# Patient Record
Sex: Female | Born: 1975 | Race: White | Hispanic: No | Marital: Married | Smoking: Never smoker
Health system: Southern US, Community
[De-identification: ages and names within clinical notes are randomized; demographics above are authoritative.]

## PROBLEM LIST (undated history)

## (undated) DIAGNOSIS — Q796 Ehlers-Danlos syndrome, unspecified: Secondary | ICD-10-CM

## (undated) HISTORY — PX: TONSILLECTOMY: SUR1361

## (undated) HISTORY — PX: APPENDECTOMY: SHX54

## (undated) HISTORY — PX: CERVICAL SPINE SURGERY: SHX589

## (undated) HISTORY — PX: DILATION AND CURETTAGE OF UTERUS: SHX78

---

## 1999-04-19 ENCOUNTER — Encounter: Payer: Self-pay | Admitting: Emergency Medicine

## 1999-04-19 ENCOUNTER — Emergency Department (HOSPITAL_COMMUNITY): Admission: EM | Admit: 1999-04-19 | Discharge: 1999-04-19 | Payer: Self-pay | Admitting: Emergency Medicine

## 2001-07-10 ENCOUNTER — Encounter: Admission: RE | Admit: 2001-07-10 | Discharge: 2001-10-08 | Payer: Self-pay | Admitting: Family Medicine

## 2003-03-10 ENCOUNTER — Emergency Department (HOSPITAL_COMMUNITY): Admission: EM | Admit: 2003-03-10 | Discharge: 2003-03-10 | Payer: Self-pay | Admitting: Emergency Medicine

## 2004-10-19 ENCOUNTER — Other Ambulatory Visit: Admission: RE | Admit: 2004-10-19 | Discharge: 2004-10-19 | Payer: Self-pay | Admitting: Obstetrics and Gynecology

## 2005-02-01 ENCOUNTER — Encounter: Admission: RE | Admit: 2005-02-01 | Discharge: 2005-02-01 | Payer: Self-pay | Admitting: Obstetrics and Gynecology

## 2006-02-03 ENCOUNTER — Encounter: Admission: RE | Admit: 2006-02-03 | Discharge: 2006-02-03 | Payer: Self-pay | Admitting: Obstetrics and Gynecology

## 2006-05-23 ENCOUNTER — Other Ambulatory Visit: Admission: RE | Admit: 2006-05-23 | Discharge: 2006-05-23 | Payer: Self-pay | Admitting: Gynecology

## 2006-09-19 ENCOUNTER — Inpatient Hospital Stay (HOSPITAL_COMMUNITY): Admission: EM | Admit: 2006-09-19 | Discharge: 2006-09-25 | Payer: Self-pay | Admitting: Emergency Medicine

## 2006-09-19 ENCOUNTER — Ambulatory Visit: Payer: Self-pay | Admitting: Cardiology

## 2006-09-20 ENCOUNTER — Encounter (INDEPENDENT_AMBULATORY_CARE_PROVIDER_SITE_OTHER): Payer: Self-pay | Admitting: Internal Medicine

## 2008-10-31 IMAGING — CR DG CHEST 2V
2 series · 2 of 2 positions shown · non-contrast
Comparison: none

CLINICAL DATA: Dizziness, vertigo, syncope

Chest 2 view:
Comparison 03/10/2003. The heart size and mediastinal contours are within normal
limits.  Both lungs are clear.  The visualized skeletal structures are
unremarkable.

[w chest pa]
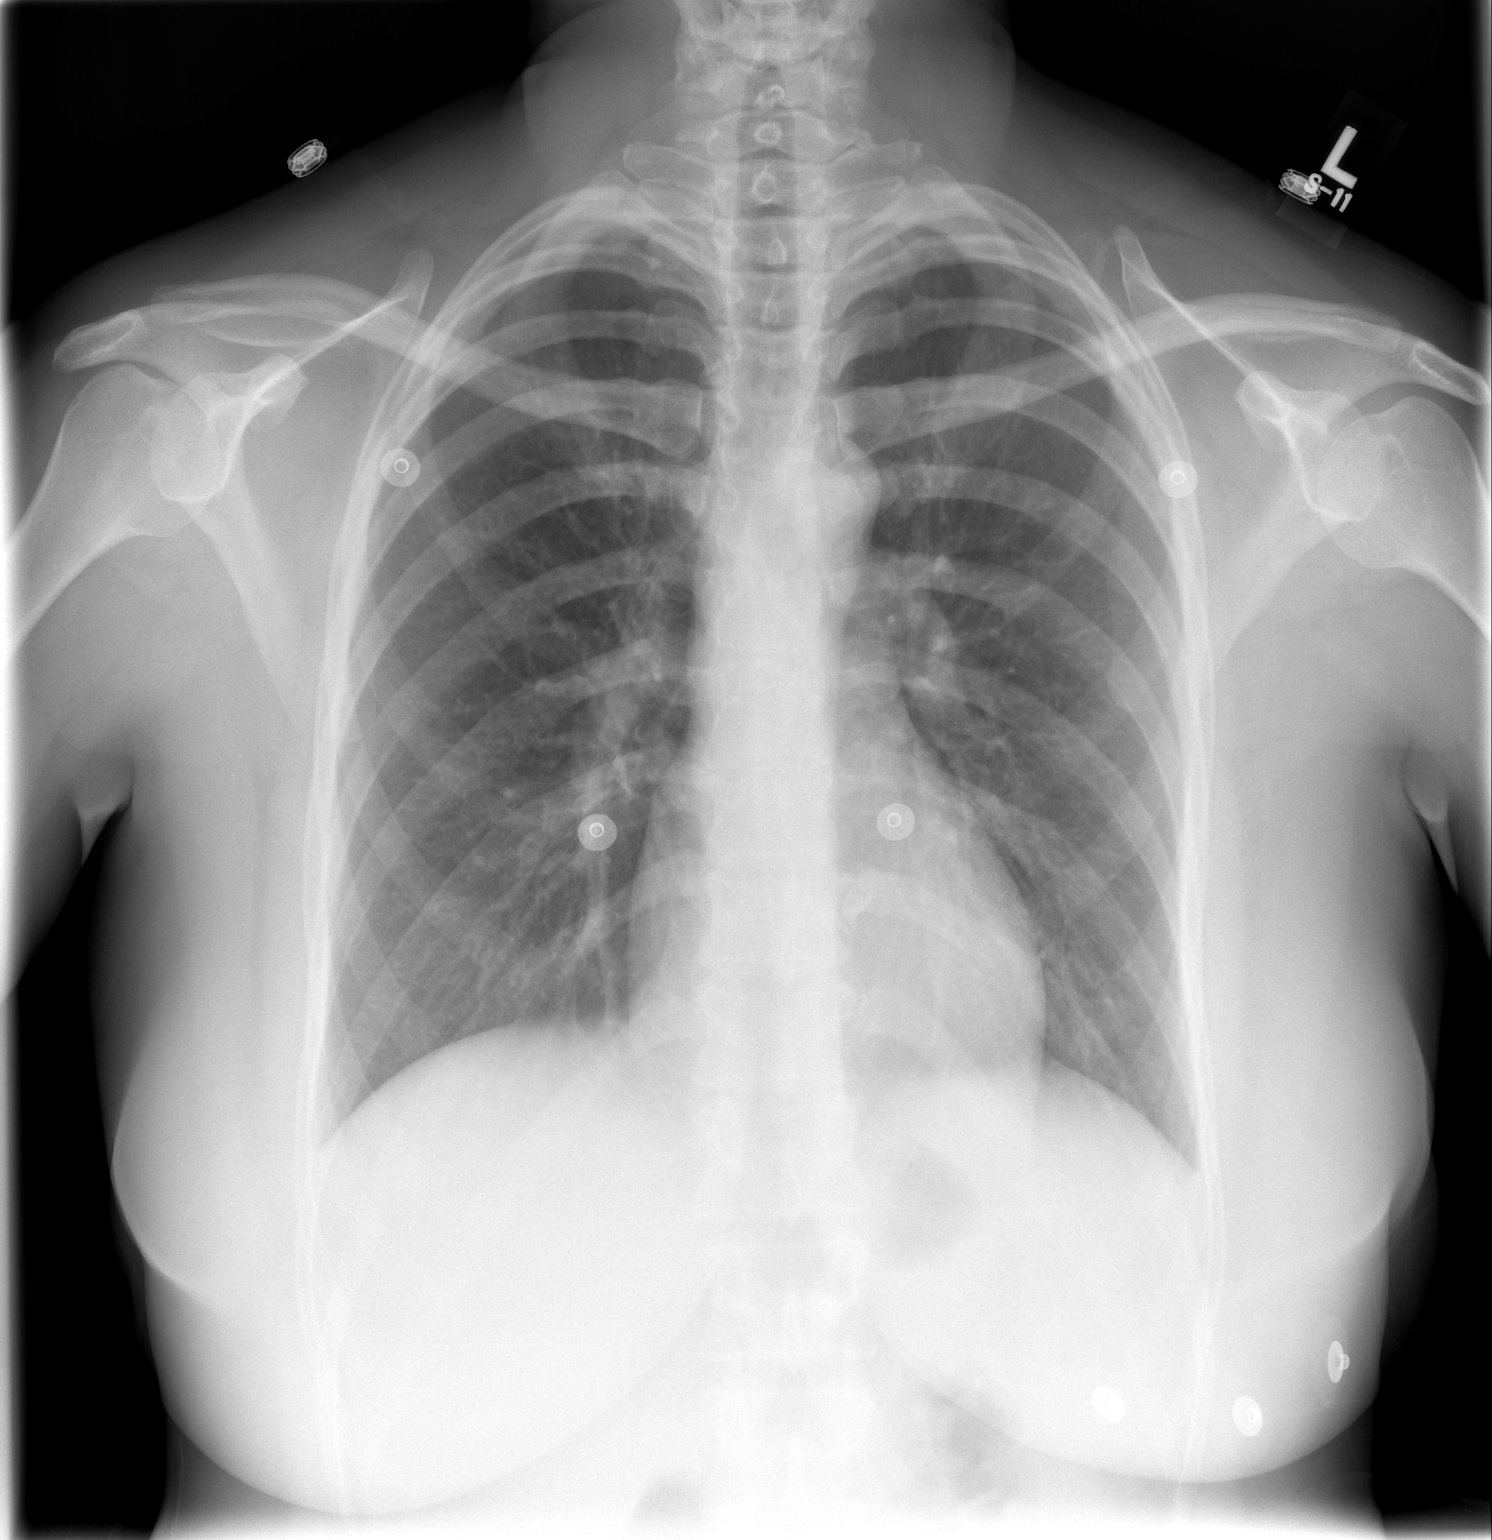

[w chest lat]
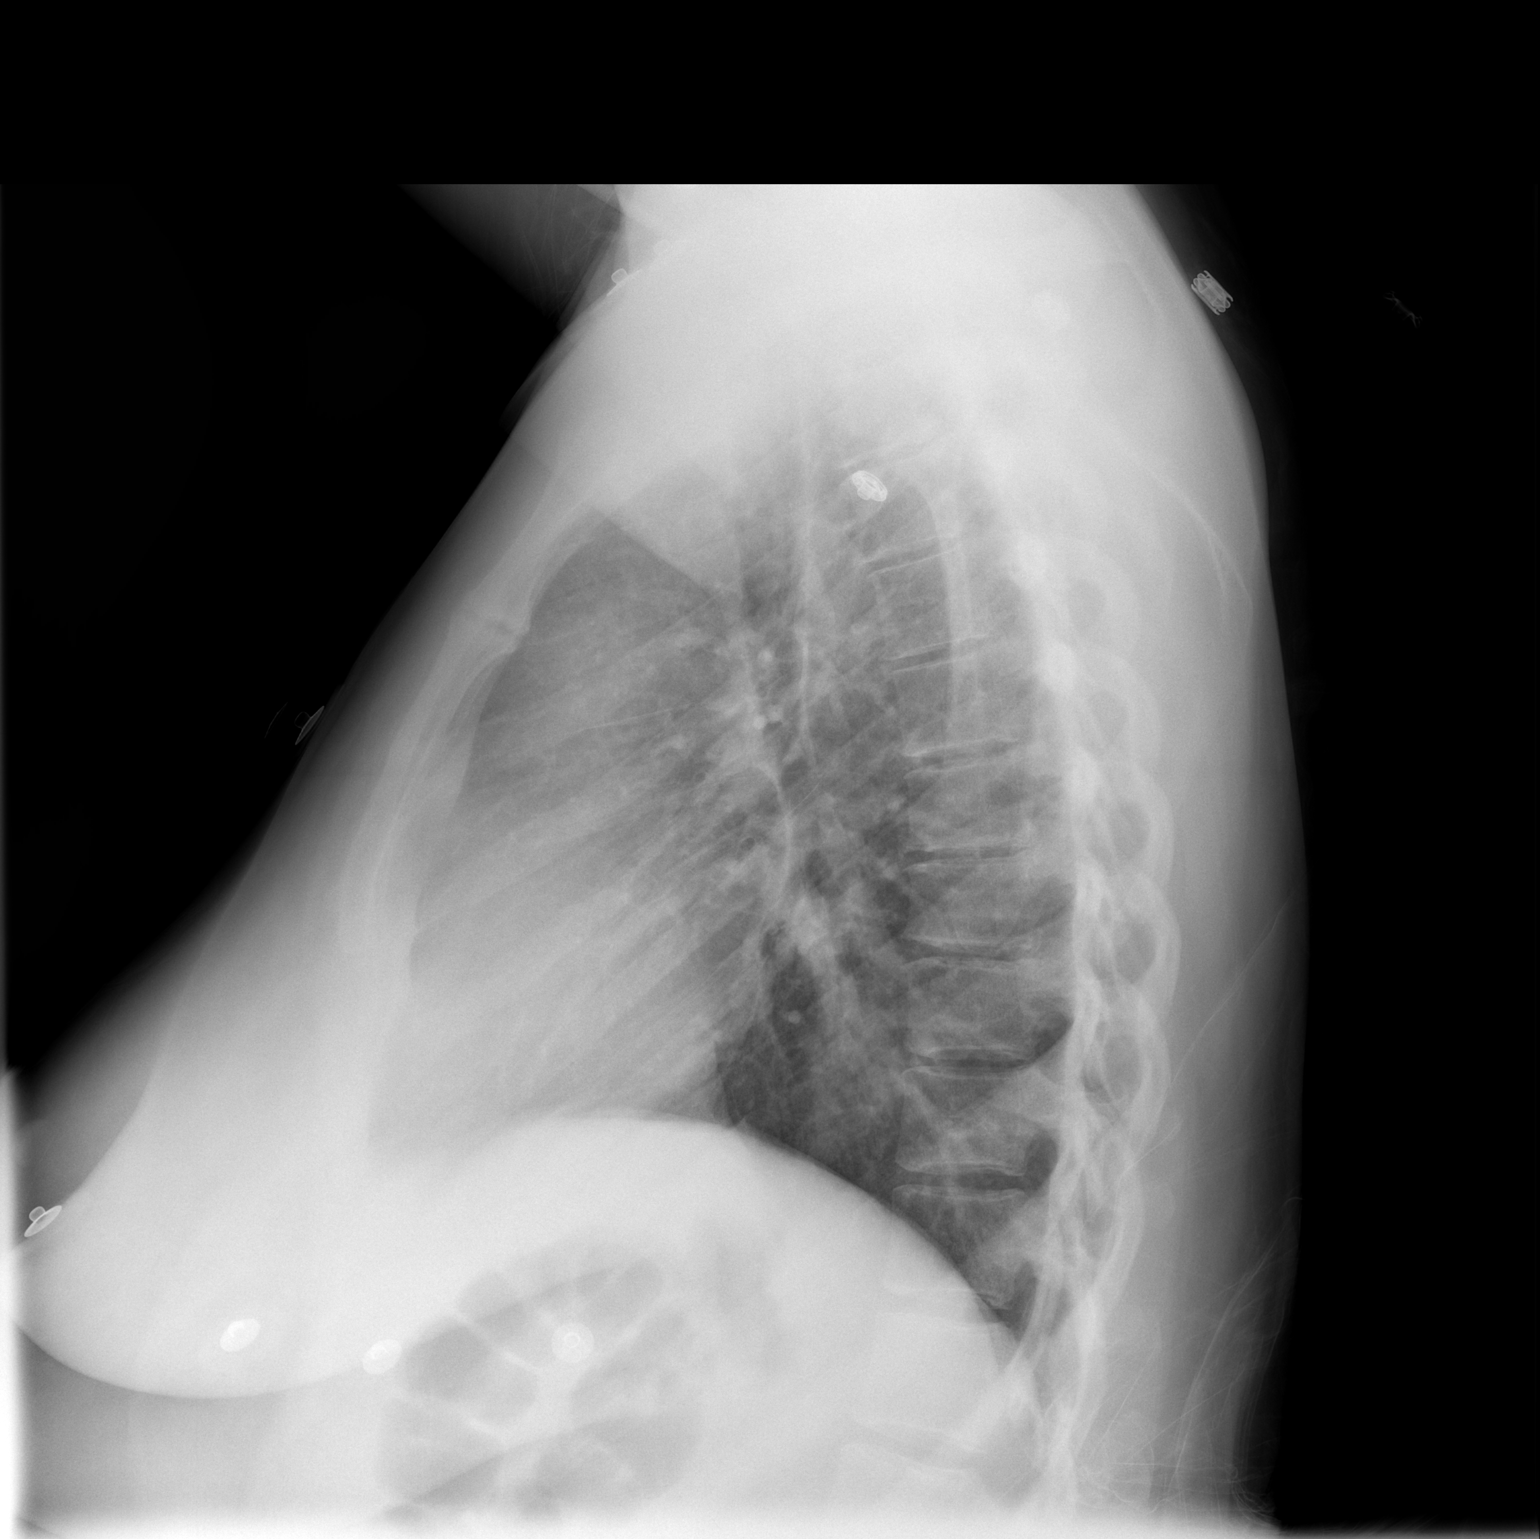

[2 of 2 positions shown; findings below may reference images not displayed]

IMPRESSION: 1. No active cardiopulmonary disease.

## 2008-11-05 IMAGING — CT CT ANGIO CHEST
2 of 4 series · 19 of 36 positions shown · IV contrast (100 ML OMNI 300)
Comparison: Chest radiographs dated 09/19/2006.

CLINICAL DATA: Shortness of breath. Multiple syncopal episodes. Dizziness.
Headache.

CT ANGIOGRAPHY OF CHEST - PULMONARY EMBOLISM PROTOCOL
TECHNIQUE: Multidetector CT imaging of the chest was performed according to the
protocol for detection of pulmonary embolism during bolus injection of
intravenous contrast.  Coronal and sagittal plane CT angiographic image
reconstructions were also generated.
Contrast:  100 cc Omnipaque 300

[Series 2: pe · axial · 0.65mm/px · z∈[-210,-26]mm · 16 of 168 slices shown]
[im 10/168  lung]
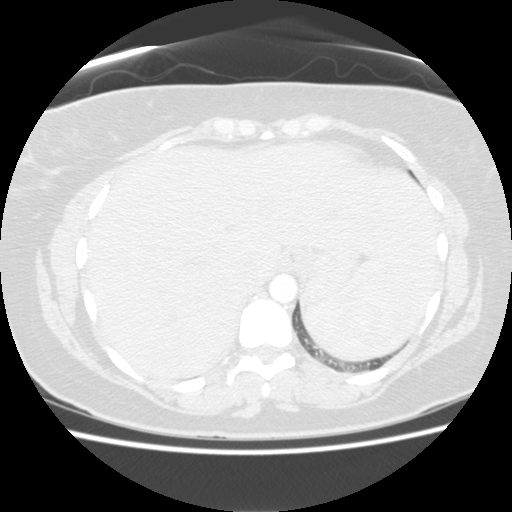
[im 19/168  mediastinal]
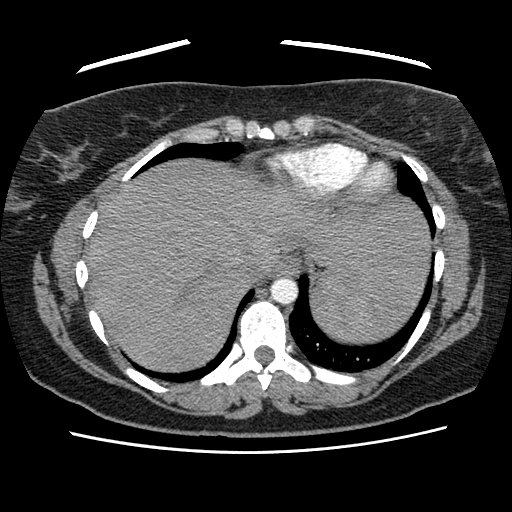
[im 28/168  lung]
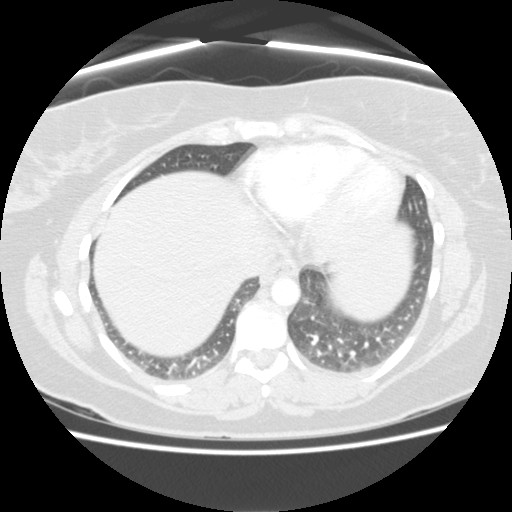
[im 38/168  mediastinal]
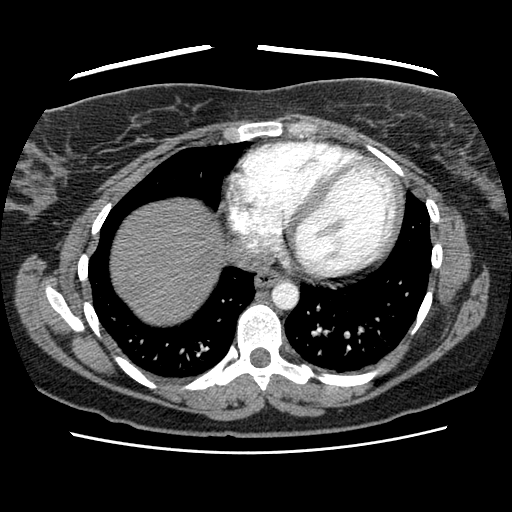
[im 47/168  lung]
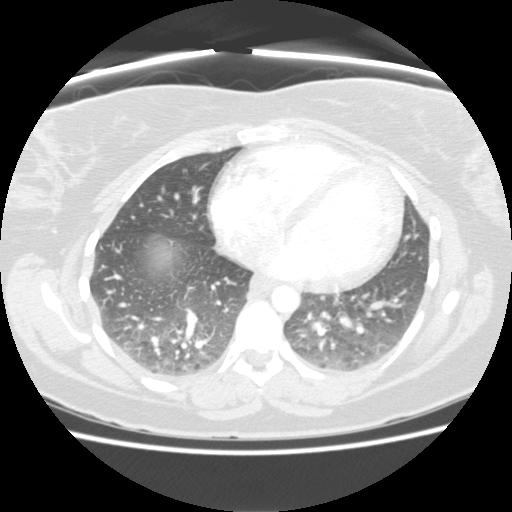
[im 56/168  mediastinal]
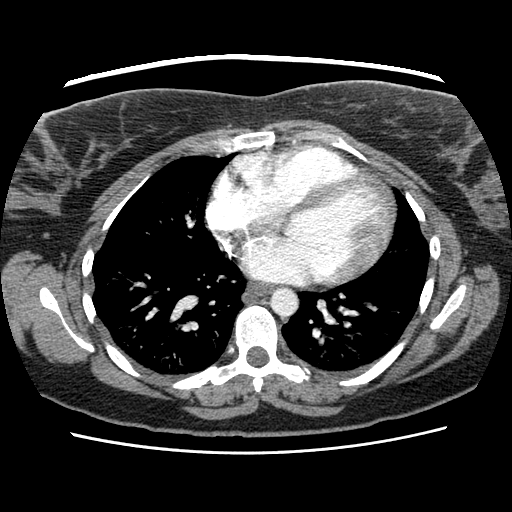
[im 65/168  lung]
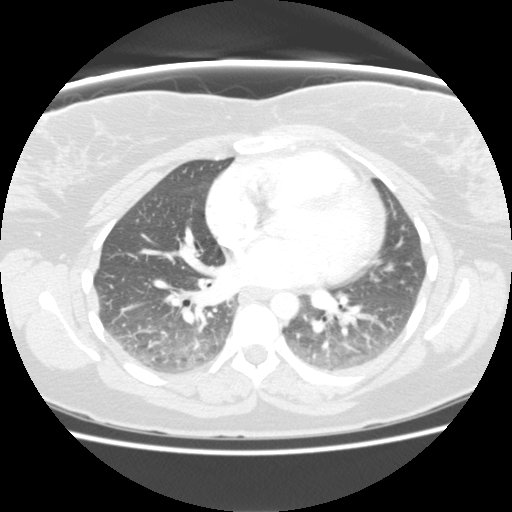
[im 75/168  mediastinal]
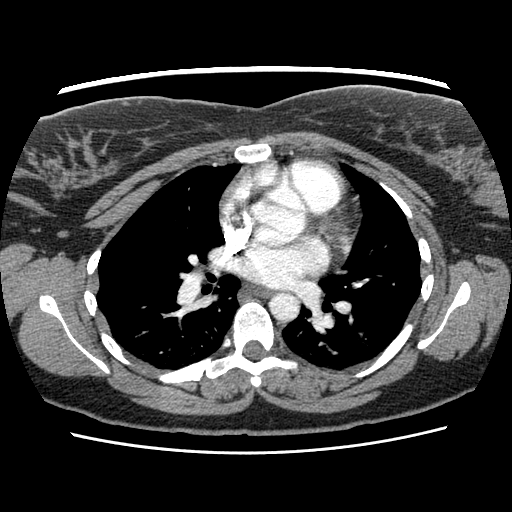
[im 93/168  lung]
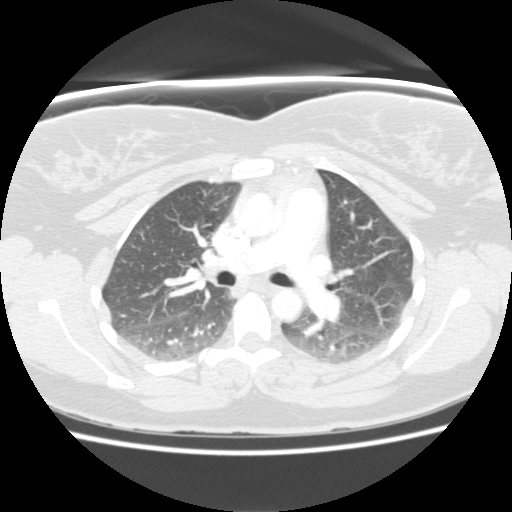
[im 103/168  mediastinal]
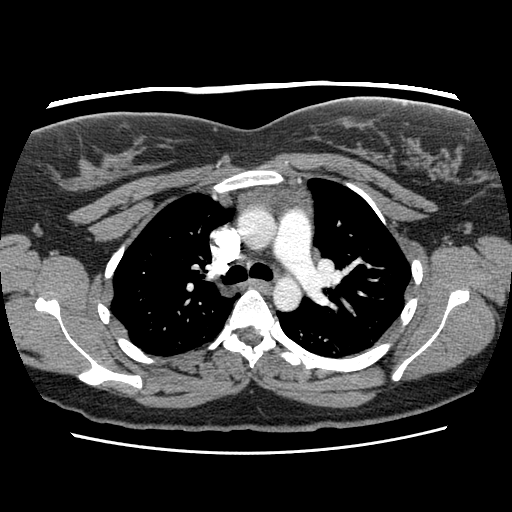
[im 112/168  lung]
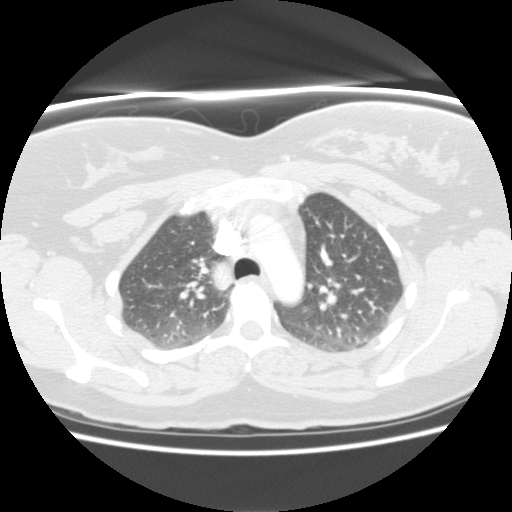
[im 121/168  mediastinal]
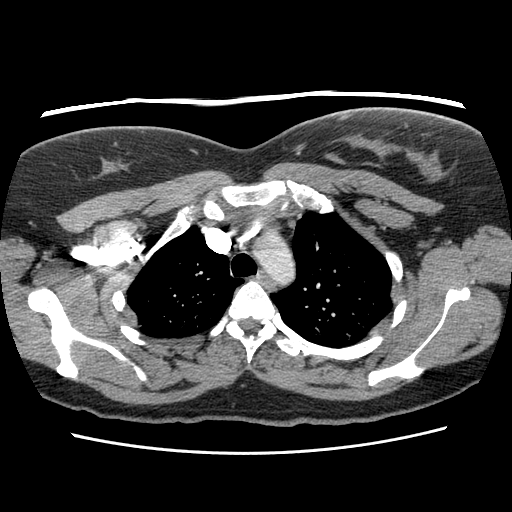
[im 130/168  lung]
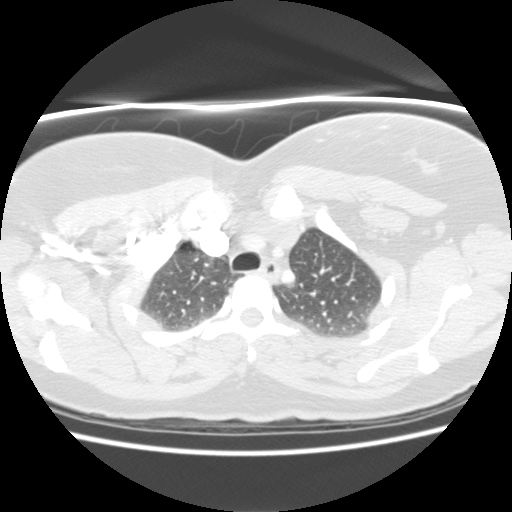
[im 140/168  mediastinal]
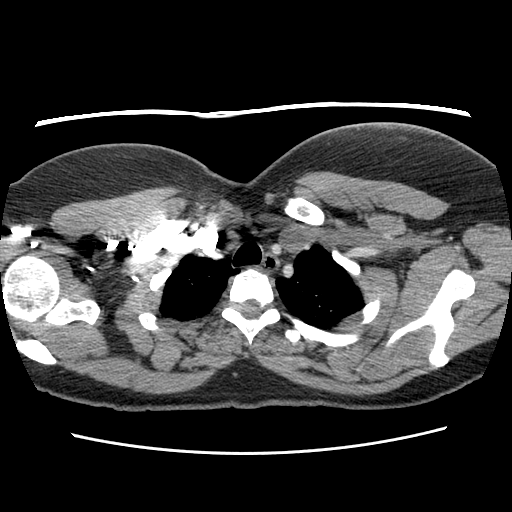
[im 149/168  lung]
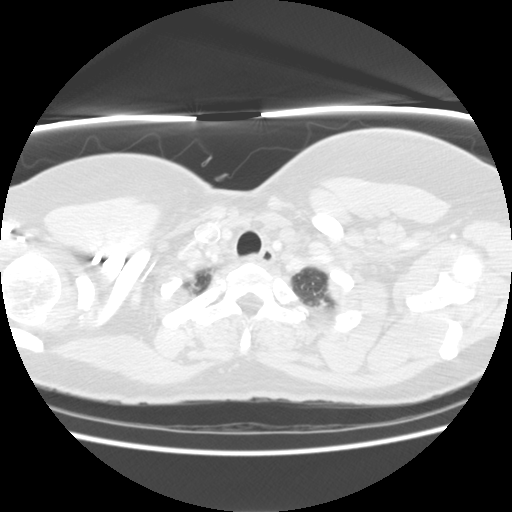
[im 158/168  mediastinal]
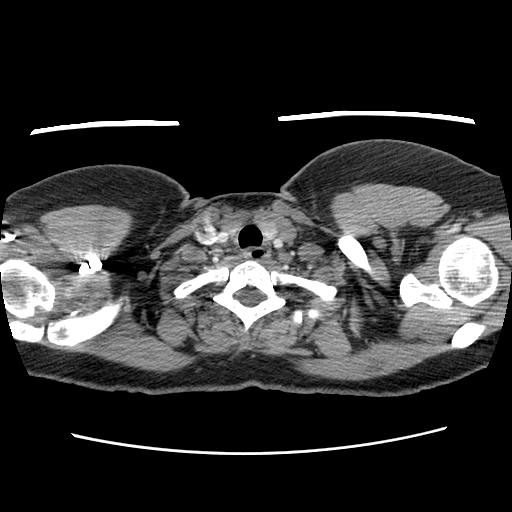

[Series 201: cor · coronal · 0.64mm/px · 3 of 108 slices shown]
[im 22/108  mediastinal]
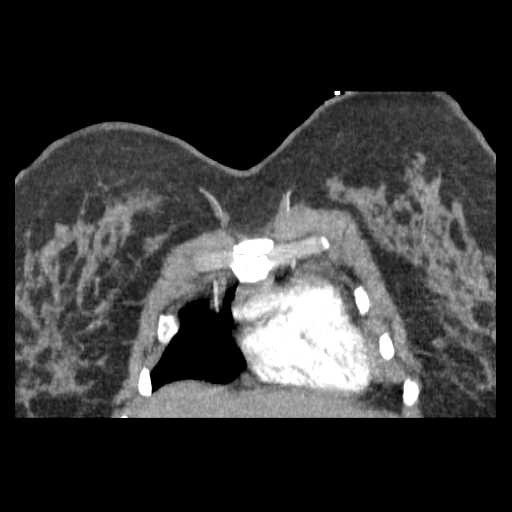
[im 43/108  mediastinal]
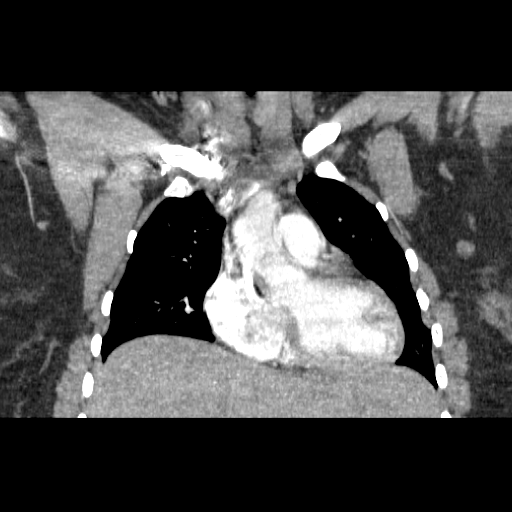
[im 65/108  mediastinal]
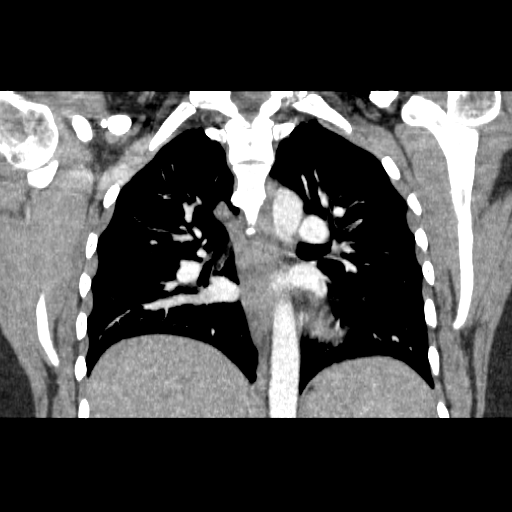

[19 of 36 positions shown; findings below may reference images not displayed]

FINDINGS: Normally opacified pulmonary arteries with no pulmonary arterial
filling defects seen. Mild dependent atelectasis in both lower lobes. No lung
masses or enlarged lymph nodes. No pleural fluid. Mild thoracic spine
degenerative changes.

IMPRESSION

No pulmonary emboli or acute abnormality.

## 2010-06-15 NOTE — H&P (Signed)
Marissa Boone             ACCOUNT NO.:  [id number]   MEDICAL RECORD NO.:  [id number]          PATIENT TYPE:  INP   LOCATION:  5502                         FACILITY:  [facility]   PHYSICIAN:  Kela Millin, M.D.DATE OF BIRTH:  15-Sep-1975   DATE OF PROCEDURE:  DATE OF DISCHARGE:                    STAT - MUST CHANGE TO CORRECT WORK TYPE   PRIMARY CARE PHYSICIAN:  [facility] (  Dr.'s [facility]).   CHIEF COMPLAINT:  Syncope.   HISTORY OF PRESENT ILLNESS:  The patient is a 35 year old white female  who presents after a syncopal episode at work.  She admits to feeling  (swimmy-headed) and having difficulty with her balance, as she was  walking prior to this episode.  She denies palpitations, chest pain,  focal weakness, dysphagia and no blurry vision.  Marissa Boone states  that she was in her usual state of health, until about a month ago -  July 22nd, when she developed flu-like symptoms - feeling achy, tired,  nauseous with subjective fevers and the time and just overall feeling  run down and also felt anxious, and this was shortly after she had  moved from one apartment to the other.  She was also having problems  with decreased p.o. intake, and subsequently on July 31st, she went to  the [facility] office, and following her evaluation she  was diagnosed with a viral illness, and then was started on Lexapro for  her anxiety.  She states that about a week later, she began having  dizziness, which she describes as feeling swimmy-headed and losing her  balance.  A few days later while at work, she passed out, but she states  she attributed this to maybe standing up too fast, and so went home that  day, rested, drank a lot of fluids and Gatorade and subsequently went  back to work again, but continued to have the same symptoms, so she was  taken to her primary care physician's office, after what sounds like a  pre-syncopal episode.  During this followup visit, the Lexapro was  discontinued, and patient started on Antivert at that time for Vertigo.  She states that the Antivert helped with the Vertigo, but she was unable  to function otherwise, because of the sedation and feeling not clear-  minded/some confusion, and so after initially taking it on a scheduled  basis, she started taking it p.r.n. only.  Subsequently, in the past few  days had just stopped taking it.  In the past week at work, she states  that she just did not feel right and that her symptoms are usually worse  in the morning, so she went and saw her primary care physician, this  time Dr. Clarene Duke.  An MRI of the brain was done (On August 13).  She  states that she was called this week to let her know that the MRI showed  sinusitis, and she was started on Cipro.  She took her third dose of it  on the morning of admission, and after going to work, she began having  vertigo, and as she was walking,  she felt like she quite could not  balance herself well, and shortly thereafter she was told that she  passed out and that this lasted a few minutes.  No jerking, urinary  incontinence or tongue-biting reported.  She had another syncopal  episode again, prior to arrival in the ER.  She denies fevers at this  time, chest pain, shortness of breath, diarrhea, dysuria, melena and no  hematemesis.  She admits to nausea but no vomiting.  However, upon  arrival in the ER, the patient's blood pressure was 100/65, and on  recheck it was 91/67.  Workup included a urinalysis, which was negative  for infection, and a chest x-ray was negative for acute infiltrates, and  her chemistries are unremarkable.  She also had a mono-screen done,  which was negative.  She is admitted for further evaluation and  management.  The patient states that her vertigo is not affected by  position, and she has not noticed any precipitating or aggravating  factors.    PAST MEDICAL HISTORY:  As above.   MEDICATIONS:  Started on Cipro recently, 500 mg b.i.d.   ALLERGIES:  NKDA.   SOCIAL HISTORY:  Denies tobacco, also denies alcohol.   FAMILY HISTORY:  She states that she was told that her paternal aunt had  breast cancer.  Her grandmother had lung cancer but was a smoker.   REVIEW OF SYSTEMS:  As per HPI.  Other review of systems negative.   PHYSICAL EXAMINATION:  GENERAL:  The patient is a young, white female.  She is alert, well-developed, well-nourished, in no apparent distress.  VITAL SIGNS:  Her blood pressure is 9/6, initially 165, pulse of 81,  respiratory rate of 18, O2 sat of 99%.  HEENT:  PERRL, EOMI, slightly dry mucus membranes, sclerae anicteric, no  oral exudates.  Left maxillary sinus tenderness.  NECK:  Supple, no lymphadenopathy, no thyromegaly and no carotid bruits  appreciated.  LUNGS:  Clear to auscultation bilaterally.  No crackles or wheezes.  CARDIOVASCULAR:  Regular rate and rhythm.  Normal S1, S2.  ABDOMEN:  Soft, bowel sound present.  Nontender, nondistended.  No  organomegaly and no masses palpable.  EXTREMITIES:  No cyanosis and no edema.  NEURO:  Is alert and oriented x 3.  Cranial nerves 2-12 grossly intact.  Her strength is 5/5 and symmetric.  Sensory is grossly intact.  Nonfocal  exam.   LABORATORY DATA:  Chest x-ray:  Negative for acute cardiopulmonary  disease.  Urinalysis is negative for infection.  The specific gravity is  1.014.  White cell count is 6.1, hemoglobin of 13.1, hematocrit of 38.2,  platelet count 253, neutrophil count 61%.  Her mono-screen is negative.  Urine pregnancy test is negative.  Sodium is 138 with a potassium of  3.8.  Chloride 103, CO2 of 25, glucose 87, BUN of 8, creatinine 0.72,  calcium 9.2, LFTs unremarkable.   ASSESSMENT/PLAN:  1. Syncope - As discussed above, patient with a low blood pressure of      91/67 in the ER, likely secondary to volume depletion/hypovolemia.      She  has had decreased p.o. intake, over some time in the past      month.  Will monitor on telemetry, obtain cardiac enzymes and a 2D      echo in a.m.  As discussed already, the patient had an outpatient      MRI with no cerebrovascular accident noted, per Dr. Clarene Duke, it  showed maxillary sinusitis.  Follow and obtain this outpatient      study from the office.  Follow and consider further evaluation,      pending the above study results.  2. Vertigo - Likely secondary to labyrinthitis, patient was diagnosed      with a viral illness about a month ago, and the report of an      outpatient MRI as above, per primary care physician, is positive      only for maxillary sinusitis.  Will place patient on a lower dose      of Antivert, consult PT for vestibular exercises and follow.  3. Maxillary sinusitis -  As already discussed above, recently started      on Cipro, but her symptoms worsened with that.  Will treat with      Claritin and Flonase at this time, follow.  Obtain MRI from office      and consider alternative antibiotics, if any worsening.      Kela Millin, M.D.  Electronically Signed     ACV/MEDQ  D:  09/20/2006  T:  09/20/2006  Job:  [id number]   cc:   Sigmund Hazel, M.D.  Anna Genre Little, M.D.

## 2010-06-15 NOTE — Consult Note (Signed)
[name], [name]             ACCOUNT NO.:  [id number]   MEDICAL RECORD NO.:  [id number]          PATIENT TYPE:  INP   LOCATION:  5502                         FACILITY:  [facility]   PHYSICIAN:  Marlan Palau, M.D.  DATE OF BIRTH:  05/03/1975   DATE OF CONSULTATION:  09/21/2006  DATE OF DISCHARGE:                                 CONSULTATION   NEUROLOGY CONSULTATION   HISTORY OF PRESENT ILLNESS:  Marissa Boone is a 35 year old right-  handed white female born [birth year], with history of lightheadedness  and syncope.  This patient began feeling poorly around August 18, 2006, a  couple of days after moving to a new apartment.  The patient began  feeling run down with low-grade fevers and dizziness.  The patient went  to her primary physician who thought she had a viral illness.  The  patient, however, continued to feel dizzy and had a floaty sensation and  at times a bit of a spinning sensation.  The patient noted that the  dizziness was also associated with a headache in the bifrontal temporal  region.  The patient usually felt worse in the morning.  The patient had  fatigue, off-balance sensation, staggering from one side to the next and  no overt numbness or weakness on the arms or legs.  The patient reports  episodes of increased heart rate when waking up in the middle of the  night.  The patient had 2 syncopal events, both at work.  The patient  would feel lightheaded possibly with visual dimming prior to going out.  The patient would be out for a few moments with some diaphoresis and  nausea when she woke up.  The first syncopal event was more brief.  The  second one occurred on the day of admission and the patient would tend  to go in-and-out for a more prolonged period of time prior to fully  getting over the episode.  Patient has had poor p.o. intake prior to  coming in the hospital, but this has improved in the hospital.  The  patient still feels somewhat dizzy and  headachy.  The patient was  started on Cipro for sinusitis just prior to admission.  The patient has  had an MRI scan of the brain done at [facility] showing a normal  brain with and without gadolinium but left maxillary sinusitis was noted  with an fluid level.  The patient has had occasional faints previously  during pregnancy.  Neurology is asked to see the patient for further  evaluation.   PAST MEDICAL HISTORY:  1. History of dizziness and syncope as above.  2. Flu-like illness with left maxillary sinusitis prior to admission.  3. Status post appendectomy.  4. Status post tonsillectomy.  5. Possible anxiety disorder.  The patient was placed on Lexapro for a      short period of time prior to this admission but had gone off of      it.  The patient was felt to be somewhat anxious with the current      illness.   MEDICATIONS:  1. Cipro 500 mg every 8 hours.  2. Claritin 10 mg daily.  3. Nasonex spray daily.  4. Afrin nasal spray b.i.d.  5. Protonix 40 mg daily.  6. Transderm Scop patch 1 every 3 days.  7. Valium if needed.  8. Compazine if needed.   ALLERGIES:  No known allergies.   SOCIAL HISTORY:  Does not smoke.  Drinks alcohol on occasion.  Denies  use of illicit drugs such as cocaine or marijuana.  The patient is  separated from her husband.  They separated in September 2007.  She has  1 child.  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   FAMILY MEDICAL HISTORY:  Notable for the fact that the mother is alive.  There is a prominent family history on the mother's side for cancer with  a maternal uncle with lung cancer.  History of renal cancer, brain  cancer, bone cancer.  Maternal grandfather had colon cancer.  Coronary  artery disease runs in the family.  A maternal aunt has diabetes.  The  whereabouts of her father or any family history concerning the father's  side of the family is unknown.   Patient has 4 brothers and sisters who  are alive and well.   REVIEW OF SYSTEMS:  Notable for some recent low-grade fevers.  The  patient does report headaches as above.  Has had some slurred speech  with the use of meclizine.  Does note some neck pain and stiffness that  is chronic in nature.  Does note some shortness of breath.  Denies any  actual chest pain.  Does have the feeling of heart racing at times.  The  patient has some nausea.  No abdominal cramping.  Denies problems  controlling bowels or bladder.  Denies any focal numbness or weakness on  the arms or legs.  Does note some gait instability and the dizziness as  above.   PHYSICAL EXAMINATION:  VITALS:  Blood pressure 112/65, heart rate 82,  respiratory 18, temperature afebrile.  GENERAL:  This patient is a minimally obese white female who is alert  and cooperative at the time of examination.  HEENT:  Head is atraumatic.  EYES:  Pupils are equal, round and reactive  to light.  Discs are flat bilaterally.  NECK:  Supple.  No carotid bruits noted.  RESPIRATORY:  Examination is clear.  CARDIOVASCULAR:  Regular rate and rhythm.  No obvious murmurs or rubs  noted.  EXTREMITIES:  Without significant edema.  NEUROLOGICAL:  Cranial nerves as above.  Extraocular movements again are  full.  Some mild nystagmus at end gaze seen when looking to the left and  not to the right.  Otherwise full extraocular movements are noted.  Visual fields are full.  The patient has decreased pinprick sensation on  the left side of face when compared to the right.  Splits the midline  with vibratory sensation in the middle of the forehead.  Patient has  normal speech.  No aphasia.  Symmetric smile.  Motor testing shows 5/5  strength in all four extremities.  Good symmetric motor and tone is  noted throughout.  Sensory testing is intact to pinprick on the right  side.  Decreased pinprick on the left arm and left leg.  Vibratory  sensation is more  symmetric in nature.  The patient has good finger-nose-  finger and heel-to-shin.  Gait is relatively normal.  The patient has  slightly unsteady tandem gait but was able to perform independently.  Romberg slightly unsteady.  No drift is seen.  Deep tendon reflexes  symmetric and normal.  Toes downgoing bilaterally.  No pronator drift  was seen.   LABORATORY VALUES:  Notable for hemoglobin of 11.4, hematocrit 32.9,  white count 6.1, MCV of 89.0, platelets of 253.  Sodium 138, potassium  3.8, chloride 103, glucose 87,  BUN 8, creatinine 0.72.  Total protein  of 6.3, albumin 3.5, calcium 9.2, SGOT 23, SGPT 21 phosphatase 52.  CK  of 62.  TSH of 3.383.  Urine pregnancy test negative.  Urinalysis  reveals specific gravity of 0.014 and pH 5.5.  Mono screen negative.   IMPRESSION:  1. History of dizziness and syncope.  2. Left maxillary sinusitis.  3. Possible underlying anxiety disorder.  4. Functional examination.   This patient has had an MRI scan of the brain that does show left  maxillary sinusitis which could explain some of flu-like symptoms, low-  grade fevers, dizziness and nausea.  The patient also has been having a  headache.  I suppose migraine syndrome could be present as well.  The  patient reports episodes of waking up at night with heart racing and  feels jittery and nervous with these spells of dizziness.  She could  have an underlying anxiety disorder as well.  The patient's clinical  examination shows some functional features.  There may be some  functional overlay with her current symptoms as well.   PLAN:  1. We will check an EEG study.  2. Consider clonazepam 0.5 mg b.i.d. for anxiety and dizziness, if      there is no full benefit with treatment of the sinusitis.  3. The patient is getting some physical therapy for gait.  We will      follow the patient's clinical course while in-house.  I suspect      this patient does not have primary neurologic  disease.      Marlan Palau, M.D.  Electronically Signed     CKW/MEDQ  D:  09/21/2006  T:  09/22/2006  Job:  [id number]   cc:   Catha Gosselin, MD

## 2010-06-18 NOTE — Discharge Summary (Signed)
NAMESAMIAH, RICKLEFS             ACCOUNT NO.:  [id number]   MEDICAL RECORD NO.:  [id number]          PATIENT TYPE:  INP   LOCATION:  5502                         FACILITY:  [facility]   PHYSICIAN:  Corinna L. Lendell Caprice, MDDATE OF BIRTH:  [birth year]   DATE OF ADMISSION:  09/19/2006  DATE OF DISCHARGE:  09/25/2006                               DISCHARGE SUMMARY   DISCHARGE DIAGNOSES:  1. Syncope, suspect secondary to transient hypotension.  2. Dizziness, multifactorial.  3. Maxillary sinusitis.  4. Volume depletion, resolved.  5. Anxiety disorder.   DISCHARGE MEDICATIONS:  1. Florinef 0.1 mg p.o. daily  2. Valium 2 mg p.o. b.i.d. as needed for vertigo  3. Nasonex two spurts to each nostril daily  4. Zofran 4 mg every 6 hours as needed for nausea.  5. Continue ciprofloxacin to complete a 10-day course total.   FOLLOWUP:  1. Follow up with Dr. Lucky Cowboy or ear, nose and throat physician of      choice.  2. Follow up with Dr. Clarene Duke in a week with attention to blood      pressure.  3. Follow up with Dr. Lesia Sago as needed.   ACTIVITY:  No driving until cleared by primary care physician.  Use  walker as needed for dizziness.  She may return to work on Monday,  October 02, 2006.   CONDITION:  Stable.   CONSULTATIONS:  Dr. Lesia Sago.   PROCEDURES:  None.   PERTINENT LABORATORY DATA:  CBC unremarkable.  ESR 18, D-dimer 0.73.  Basic metabolic panel unremarkable.  Cardiac enzymes negative.  Liver  function tests normal.  Urine pregnancy negative.  TSH 3.383.  Initial  a.m. cortisol was 3.9.  Cosyntropin stimulation test revealed a cortisol  at 30 minutes of 21 and a cortisol of 60 minutes at 25.  Urine drug  screen negative on September 23, 2006.  Urinalysis negative, specific  gravity 1.014, negative ketones.  Urine culture negative.  Mono screen  negative.  Rheumatoid factor less than 20.  ANA negative.  Special  studies and radiology:  Two views of the chest on admission  negative.  CT angiogram of the chest showed no pulmonary emboli or acute  abnormality.  Mild dependent atelectasis in both lower lobes.  Mild  thoracic spine degenerative changes.  Echocardiogram showed normal  ejection fraction of 55% to 60% with no wall motion abnormalities,  normal valves.   HISTORY AND HOSPITAL COURSE:  Please see History and Physical for  complete admission details.  The patient is a 35 year old white female  patient who has seen Dr. Catha Gosselin and Dr. Sigmund Hazel in the past.  She has had about a month's worth of feeling swimmy headed.  Also, there  was some reports of the room spinning.  She developed flu-like symptoms  a month ago.  She was achy, tired, nauseated, with subjective fevers and  fatigue.  She was also feeling anxious at the time.  Apparently, she was  not eating well.  She was diagnosed with a viral illness and also  started on Lexapro for anxiety.  A week after this,  she became worse and  started losing her balance.  She passed out at work.  She had another  presyncopal episode at work.  Her Lexapro was stopped as it was felt  this may be contributing.  She was started on Antivert for vertigo.  She  reports that the Antivert did help the vertigo, but that made her too  sedated, and she was unable to function.  Therefore, she stopped taking  it completely.  A week prior to admission she had seen Dr. Clarene Duke, and  an MRI of the brain was done and showed sinusitis, and the patient was  started on ciprofloxacin.  She had been on it for 24 hours and while at  work had a syncopal episode lasting a few minutes.  There was no seizure  activity.  She had no shortness of breath, chest pain, diarrhea,  dysuria, melena.  She was nauseated but not vomiting.  Her blood  pressure was 91/67 in the emergency room, and she was admitted for  further evaluation.  The rest of her vital signs were unremarkable.  She  was in no acute distress.  She did have some  maxillary sinus tenderness  on the left with slightly dry mucous membranes.  Dr. Donna Bernard, who did the  history and physical, felt that her syncope may be related to transient  hypotension, secondary to volume depletion.  She was admitted to  telemetry.  It was felt initially that she may be suffering from  labyrinthitis.  She was started on IV fluids.  She continued to have  periods of hypotension, sometimes into the 80s, but usually ranging in  the 90's to 100 systolic.  With these episodes of hypotension, she was  rather asymptomatic, or at least, her symptoms were no worse.  She had  no significant orthostatic change in her pulse or blood pressure.  She  worked with physical therapy, and her IV fluids were continued.  The  patient requested a neurology consult.  Please see Dr. Anne Hahn'  consultation for details.  He felt that this was not true vertigo, but  that the dizziness may be secondary to the sinusitis.  He felt that many  of her symptoms could be attributed to this.  It was noted on his exam  that the patient's clinical examination showed some functional  features, and he felt that there may be some functional overlay.  He  ordered an EEG.  This was delayed for several days, and Dr. Anne Hahn felt  that this patient would not need to wait for this as an inpatient.  Upon  discharge she actually refused an outpatient EEG.  She said she may  consider it if her symptoms continued to be unexplained, or they do not  resolve.  An MRI was faxed from Dr. Fredirick Maudlin office, and it showed left  maxillary sinusitis but this was apparently incomplete in that the T1  phase was not completed.  Dr. Anne Hahn did not feel that the patient's  symptoms were a primary neurologic issue.  He also did not recommend  repeating the MRI.   The patient's Cipro was resumed.  She was also started on Nasonex and  Afrin.  She did have several headaches during her hospitalization, and  Dr. Anne Hahn felt that migraines  were within the differential but did not  recommend any specific medications, other than agreeing with the  Florinef.   The patient did not take any meclizine during the hospitalization.  She  did have  relief with Valium, however.  She also tried Compazine for  nausea, which also seemed to help her dizziness.  The patient continued  to be symptomatic, however, and a scopolamine patch was started.  The  patient's intake had  increased to normal, and she had received IV  fluids.  She continued to have transient periods of hypotension with  systolic blood pressures into the 80's.  Therefore a cosyntropin  stimulation test was done, and this was negative.  Dr. Anne Hahn felt that  certainly bedrest, inactivity, and the scopolamine could be contributing  as well as the Valium.  Scopolamine was stopped, and the patient was  encouraged to ambulate.  Initially, she was quite resistant to walking  very far, but on the day of discharge she had taking a shower and walked  the halls.  The physical therapist had recommended a rolling walker.  This was ordered, and also Home Health physical therapy was arranged for  continued physical therapy.  After Florinef was started, the patient had  no further drops in blood pressure, and she clinically appeared brighter  and was more willing to work with physical therapy.  I recommended that  she follow up with a ear, nose and throat  physician.  She is uncertain which one she will follow up with at this  time but may follow up with Dr. Gerilyn Pilgrim.  If the symptoms remain and the  patient's diagnosis is uncertain, consider tilt table test and consider  outpatient EEG.   Total time on day of discharge is 70 minutes.      Corinna L. Lendell Caprice, MD  Electronically Signed     CLS/MEDQ  D:  09/29/2006  T:  09/30/2006  Job:  [id number]   cc:   Marlan Palau, M.D.  Anna Genre Little, M.D.

## 2010-11-12 LAB — CBC
HCT: 36.6
HCT: 38.2
Hemoglobin: 12.5
MCV: 89
MCV: 89.3
Platelets: 235
RDW: 12.8
WBC: 6.1

## 2010-11-12 LAB — CORTISOL
Cortisol, Plasma: 3.9
Cortisol, Plasma: 6.9

## 2010-11-12 LAB — BASIC METABOLIC PANEL
BUN: 7
BUN: 8
Calcium: 9.2
Creatinine, Ser: 0.71
Creatinine, Ser: 0.77
GFR calc non Af Amer: >60
Glucose, Bld: 105 — ABNORMAL HIGH
Glucose, Bld: 116 — ABNORMAL HIGH
Sodium: 138
Sodium: 139

## 2010-11-12 LAB — DIFFERENTIAL
Basophils Absolute: 0
Basophils Relative: 0
Eosinophils Relative: 2
Neutrophils Relative %: 61

## 2010-11-12 LAB — URINALYSIS, ROUTINE W REFLEX MICROSCOPIC
Bilirubin Urine: NEGATIVE
Nitrite: NEGATIVE
Specific Gravity, Urine: 1.014

## 2010-11-12 LAB — COMPREHENSIVE METABOLIC PANEL
ALT: 21
AST: 23
BUN: 8
CO2: 25
Chloride: 103
GFR calc Af Amer: >60
GFR calc non Af Amer: >60
Potassium: 3.8
Total Bilirubin: 0.6
Total Protein: 6.3

## 2010-11-12 LAB — CARDIAC PANEL(CRET KIN+CKTOT+MB+TROPI)
Relative Index: INVALID
Total CK: 61
Troponin I: <0.01

## 2010-11-12 LAB — ACTH STIMULATION, 3 TIME POINTS
Cortisol, 30 Min: 21.8 (ref >20)
Cortisol, 60 Min: 25.6 (ref >20)
Cortisol, Base: 13.6

## 2010-11-12 LAB — TSH: TSH: 3.383

## 2010-11-12 LAB — CK TOTAL AND CKMB (NOT AT ARMC): Total CK: 62

## 2010-11-12 LAB — MONONUCLEOSIS SCREEN: Mono Screen: NEGATIVE

## 2010-11-12 LAB — RAPID URINE DRUG SCREEN, HOSP PERFORMED
Amphetamines: NOT DETECTED
Cocaine: NOT DETECTED

## 2010-11-12 LAB — URINE CULTURE

## 2021-12-16 ENCOUNTER — Ambulatory Visit
Admission: EM | Admit: 2021-12-16 | Discharge: 2021-12-16 | Disposition: A | Discharge status: Home / Self Care | Payer: Commercial Managed Care - PPO | Attending: Family Medicine | Admitting: Family Medicine

## 2021-12-16 DIAGNOSIS — K529 Noninfective gastroenteritis and colitis, unspecified: Secondary | ICD-10-CM

## 2021-12-16 DIAGNOSIS — A0811 Acute gastroenteropathy due to Norwalk agent: Secondary | ICD-10-CM

## 2021-12-16 HISTORY — DX: Ehlers-Danlos syndrome, unspecified: Q79.60

## 2021-12-16 LAB — POC SARS CORONAVIRUS 2 AG -  ED: SARS Coronavirus 2 Ag: NEGATIVE

## 2021-12-16 MED ORDER — ALBUTEROL SULFATE (2.5 MG/3ML) 0.083% IN NEBU: 2.5000 mg | INHALATION_SOLUTION | Freq: Once | RESPIRATORY_TRACT | Status: DC

## 2021-12-16 MED ORDER — ONDANSETRON 4 MG PO TBDP
4.0000 mg | ORAL_TABLET | Freq: Once | ORAL | Status: AC
  Administered 2021-12-16: 4 mg via ORAL

## 2021-12-16 MED ORDER — PROMETHAZINE HCL 25 MG PO TABS: 25.0000 mg | ORAL_TABLET | Freq: Four times a day (QID) | ORAL | 0 refills | Status: AC | PRN

## 2021-12-16 MED ORDER — PROMETHAZINE HCL 25 MG/ML IJ SOLN
25.0000 mg | Freq: Once | INTRAMUSCULAR | Status: AC
  Administered 2021-12-16: 25 mg via INTRAMUSCULAR

## 2021-12-16 NOTE — Discharge Instructions (Addendum)
Take medicine for nausea- phenergan When nausea improves, start clear liquids or electrolyte replacement fluids (Gatorade, Pedialyte) After you are keeping down fluids you can try bland foods, soup and crackers Tylenol for pain and fever May use Imodium if the diarrhea is problematic although it usually resolves within a day or 2 See your doctor if not improving by next week Go to ER if you are worse or become lightheaded

## 2021-12-16 NOTE — ED Provider Notes (Signed)
[facility]    CSN: [id number] Arrival date & time: 12/16/21  1641      History   Chief Complaint Chief Complaint  Patient presents with   Diarrhea   Emesis   Generalized Body Aches   Chills    HPI Marissa Boone is a 46 y.o. female.   HPI  Patient has nausea vomiting diarrhea fatigue and body aches for the last 24 hours.  Has not kept anything down since yesterday.  Temperature to 100 at home.  She works as a Social worker.  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  Before the norovirus she did have a slight cough.  She is a non-smoker.  She has been exposed to illness from the children, nonspecific  Past Medical History:  Diagnosis Date   EDS (Ehlers-Danlos syndrome)     There are no problems to display for this patient.   Past Surgical History:  Procedure Laterality Date   APPENDECTOMY     CERVICAL SPINE SURGERY     DILATION AND CURETTAGE OF UTERUS     TONSILLECTOMY      OB History   No obstetric history on file.      Home Medications    Prior to Admission medications   Not on File    Family History Family History  Problem Relation Age of Onset   Healthy Mother     Social History Social History   Tobacco Use   Smoking status: Never   Smokeless tobacco: Never  Vaping Use   Vaping Use: Never used  Substance Use Topics   Alcohol use: Yes    Comment: occasionally   Drug use: Not Currently     Allergies   Hydrocodone-acetaminophen   Review of Systems Review of Systems See HPI  Physical Exam Triage Vital Signs ED Triage Vitals  Enc Vitals Group     BP 12/16/21 1657 112/76     Pulse Rate 12/16/21 1657 88     Resp 12/16/21 1657 20     Temp 12/16/21 1657 100.2 F (37.9 C)     Temp Source 12/16/21 1657 Oral     SpO2 12/16/21 1657 97 %     Weight 12/16/21 1656 200 lb (90.7 kg)     Height 12/16/21 1656 5\' 9"  (1.753 m)     Head Circumference --      Peak Flow --      Pain Score 12/16/21 1655 8     Pain Loc --       Pain Edu? --      Excl. in GC? --    No data found.  Updated Vital Signs BP 112/76 (BP Location: Right Arm)   Pulse 88   Temp 100.2 F (37.9 C) (Oral)   Resp 20   Ht 5\' 9"  (1.753 m)   Wt 90.7 kg   LMP  (LMP Unknown)   SpO2 97%   BMI 29.53 kg/m       Physical Exam Constitutional:      General: She is not in acute distress.    Appearance: She is well-developed. She is ill-appearing.     Comments: Patient's overweight.  Obviously ill.  Repetitive dry heaving.  HENT:     Head: Normocephalic and atraumatic.  Eyes:     Conjunctiva/sclera: Conjunctivae normal.     Pupils: Pupils are equal, round, and reactive to light.  Cardiovascular:     Rate and Rhythm: Normal rate and regular rhythm.  Pulmonary:  Effort: Pulmonary effort is normal. No respiratory distress.     Breath sounds: Normal breath sounds.  Abdominal:     General: There is no distension.     Palpations: Abdomen is soft.  Musculoskeletal:        General: Normal range of motion.     Cervical back: Normal range of motion and neck supple.  Skin:    General: Skin is warm and dry.  Neurological:     Mental Status: She is alert.      UC Treatments / Results  Labs (all labs ordered are listed, but only abnormal results are displayed) Labs Reviewed  POC SARS CORONAVIRUS 2 AG -  ED    EKG   Radiology No results found.  Procedures Procedures (including critical care time)  Medications Ordered in UC Medications  promethazine (PHENERGAN) injection 25 mg (has no administration in time range)  ondansetron (ZOFRAN-ODT) disintegrating tablet 4 mg (4 mg Oral Given 12/16/21 1702)  ondansetron (ZOFRAN-ODT) disintegrating tablet 4 mg (4 mg Oral Given 12/16/21 1721)    Initial Impression / Assessment and Plan / UC Course  I have reviewed the triage vital signs and the nursing notes.  Pertinent labs & imaging results that were available during my care of the patient were reviewed by me and considered  in my medical decision making (see chart for details).     Patient asked for IV fluids, however, she has moist mucous membranes and normal vital signs.  I do not feel that she is dehydrated.  She did not get improvement in her severe nausea with 2 Zofran 4 mg tabs.  After 30 minutes she was still dry heaving.  We will try to injection of promethazine After promethazine patient is improved.  Sleepy.  Is sent home with instructions Final Clinical Impressions(s) / UC Diagnoses   Final diagnoses:  Infection due to Norovirus species  Gastroenteritis     Discharge Instructions      Take medicine for nausea When nausea improves, start clear liquids or electrolyte replacement fluids (Gatorade, Pedialyte) After you are keeping down fluids you can try bland foods, soup and crackers Tylenol for pain and fever May use Imodium if the diarrhea is problematic although it usually resolves within a day or 2 See your doctor if not improving by next week Go to ER if you are worse or become lightheaded   ED Prescriptions   None    PDMP not reviewed this encounter.   Eustace Moore, MD 12/16/21 [id number]

## 2021-12-16 NOTE — ED Triage Notes (Signed)
Pt presents to Urgent Care with c/o chills, body aches, diarrhea, and vomiting since last night.

## 2022-05-09 ENCOUNTER — Ambulatory Visit
Admission: EM | Admit: 2022-05-09 | Discharge: 2022-05-09 | Disposition: A | Discharge status: Home / Self Care | Payer: Commercial Managed Care - PPO | Attending: Family Medicine | Admitting: Family Medicine

## 2022-05-09 ENCOUNTER — Other Ambulatory Visit (HOSPITAL_BASED_OUTPATIENT_CLINIC_OR_DEPARTMENT_OTHER): Payer: Self-pay

## 2022-05-09 ENCOUNTER — Ambulatory Visit (INDEPENDENT_AMBULATORY_CARE_PROVIDER_SITE_OTHER): Payer: Commercial Managed Care - PPO | Admitting: Student

## 2022-05-09 ENCOUNTER — Other Ambulatory Visit: Payer: Self-pay | Admitting: *Deleted

## 2022-05-09 ENCOUNTER — Encounter: Payer: Self-pay | Admitting: Emergency Medicine

## 2022-05-09 ENCOUNTER — Ambulatory Visit (INDEPENDENT_AMBULATORY_CARE_PROVIDER_SITE_OTHER): Payer: Commercial Managed Care - PPO

## 2022-05-09 DIAGNOSIS — M25562 Pain in left knee: Secondary | ICD-10-CM

## 2022-05-09 DIAGNOSIS — M25462 Effusion, left knee: Secondary | ICD-10-CM

## 2022-05-09 MED ORDER — KETOROLAC TROMETHAMINE 30 MG/ML IJ SOLN
60.0000 mg | Freq: Once | INTRAMUSCULAR | Status: AC
  Administered 2022-05-09: 60 mg via INTRAMUSCULAR

## 2022-05-09 MED ORDER — IBUPROFEN 800 MG PO TABS
800.0000 mg | ORAL_TABLET | Freq: Three times a day (TID) | ORAL | 0 refills | Status: AC | PRN
  Filled 2022-05-09: qty 15, 5d supply, fill #0

## 2022-05-09 MED ORDER — ACETAMINOPHEN 325 MG PO TABS
650.0000 mg | ORAL_TABLET | Freq: Once | ORAL | Status: AC
  Administered 2022-05-09: 650 mg via ORAL

## 2022-05-09 NOTE — ED Notes (Signed)
Patient is being discharged from the Urgent Care and sent to the Emergency Department via POV. Per Dr Delton See, patient is in need of higher level of care due to knee injury. Patient is aware and verbalizes understanding of plan of care.  Vitals:   05/09/22 0917  BP: 134/89  Pulse: 88  Resp: 18  Temp: 97.8 F (36.6 C)  SpO2: 98%

## 2022-05-09 NOTE — ED Notes (Signed)
Pt laying down on exam table

## 2022-05-09 NOTE — Discharge Instructions (Signed)
Continue ibuprofen 800 mg 3 times a day with food Ice to knee Limit walking while knee is painful See orthopedic in follow up

## 2022-05-09 NOTE — ED Triage Notes (Signed)
Left knee pain since this am Happened at 0630 OTC  ibuprofen 800 mg at 0645 Pt has not eaten or drank anything  Pt  felt her knee go out  out of alignment at 0630 this am  Here w/ husband Pt is tearful, unable to sitt Pt iced for 90 miin pta

## 2022-05-09 NOTE — ED Provider Notes (Signed)
[facility]    CSN: [id number] Arrival date & time: 05/09/22  0904      History   Chief Complaint Chief Complaint  Patient presents with   Knee Pain    Left     HPI Marissa Boone is a 47 y.o. female.   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  She states that she has a lot of stiffness in her joints and has to do a lot of stretching.  She states she has a lot of clicking and noise in her joints and feels like they have to "settle" when she changes position.  She states that she feels like her joints sublux frequently, at term the physical therapist described.  She states that today she was leaving for work she had however weight on her right leg and when she tried to pick up her left leg it "went out" and caused severe pain.  She is unable to put any weight on the leg.  She did not have any fall twisting or injury to the leg.  The leg remains severely painful.  She is put ice on it.  She is taking some ibuprofen.   Past Medical History:  Diagnosis Date   EDS (Ehlers-Danlos syndrome)   (Diagnosis questionable-[facility])  There are no problems to display for this patient.   Past Surgical History:  Procedure Laterality Date   APPENDECTOMY     CERVICAL SPINE SURGERY     DILATION AND CURETTAGE OF UTERUS     TONSILLECTOMY      OB History   No obstetric history on file.      Home Medications    Prior to Admission medications   Medication Sig Start Date End Date Taking? Authorizing Provider  promethazine (PHENERGAN) 25 MG tablet Take 1 tablet (25 mg total) by mouth every 6 (six) hours as needed for nausea or vomiting. Patient not taking: Reported on 05/09/2022 12/16/21   Eustace Moore, MD    Family History Family History  Problem Relation Age of Onset   Healthy Mother     Social History Social History   Tobacco Use   Smoking status: Never   Smokeless tobacco: Never  Vaping Use   Vaping Use:  Never used  Substance Use Topics   Alcohol use: Yes    Comment: occasionally   Drug use: Not Currently     Allergies   Hydrocodone-acetaminophen   Review of Systems Review of Systems See HPI  Physical Exam Triage Vital Signs ED Triage Vitals  Enc Vitals Group     BP 05/09/22 0917 134/89     Pulse Rate 05/09/22 0917 88     Resp 05/09/22 0917 18     Temp 05/09/22 0917 97.8 F (36.6 C)     Temp Source 05/09/22 0917 Oral     SpO2 05/09/22 0917 98 %     Weight 05/09/22 0920 215 lb (97.5 kg)     Height 05/09/22 0920 5\' 9"  (1.753 m)     Head Circumference --      Peak Flow --      Pain Score 05/09/22 0919 10     Pain Loc --      Pain Edu? --      Excl. in GC? --    No data found.  Updated Vital Signs BP 134/89 (BP Location: Right Arm)   Pulse 88   Temp 97.8 F (36.6 C) (Oral)  Resp 18   Ht 5\' 9"  (1.753 m)   Wt 97.5 kg   SpO2 98%   BMI 31.75 kg/m      Physical Exam Constitutional:      General: She is in acute distress.     Appearance: She is well-developed.     Comments: Patient is tearful.  Resists any movement of left leg.  HENT:     Head: Normocephalic and atraumatic.  Eyes:     Conjunctiva/sclera: Conjunctivae normal.     Pupils: Pupils are equal, round, and reactive to light.  Cardiovascular:     Rate and Rhythm: Normal rate.  Pulmonary:     Effort: Pulmonary effort is normal. No respiratory distress.  Abdominal:     General: There is no distension.     Palpations: Abdomen is soft.  Musculoskeletal:        General: Signs of injury present. No swelling or deformity. Normal range of motion.     Cervical back: Normal range of motion.     Comments: Both knees appear normal.  Patient resists any movement of her left leg keeps it stiff.  She has no tenderness anteriorly or over the patella.  She does have tenderness the posterior knee.  No tenderness around the joint line.  No effusion appreciated.  No warmth appreciated.  Skin:    General: Skin is  warm and dry.  Neurological:     Mental Status: She is alert.      UC Treatments / Results  Labs (all labs ordered are listed, but only abnormal results are displayed) Labs Reviewed - No data to display  EKG   Radiology DG Knee Complete 4 Views Left  Result Date: 05/09/2022 CLINICAL DATA:  Pain.  Sensation of malalignment. EXAM: LEFT KNEE - COMPLETE 4+ VIEW COMPARISON:  None Available. FINDINGS: Small amount of joint fluid. No fracture or dislocation. Normal weight-bearing compartment joint space height. 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 No other focal finding. IMPRESSION: Small amount of joint fluid. 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 Electronically Signed   By: Paulina Fusi M.D.   On: 05/09/2022 10:40    Procedures Procedures (including critical care time)  Medications Ordered in UC Medications  acetaminophen (TYLENOL) tablet 650 mg (650 mg Oral Given 05/09/22 0928)  ketorolac (TORADOL) 30 MG/ML injection 60 mg (60 mg Intramuscular Given 05/09/22 0947)    Initial Impression / Assessment and Plan / UC Course  I have reviewed the triage vital signs and the nursing notes.  Pertinent labs & imaging results that were available during my care of the patient were reviewed by me and considered in my medical decision making (see chart for details).     Patient directed to the same day injury ortho clinic Final Clinical Impressions(s) / UC Diagnoses   Final diagnoses:  Acute pain of left knee     Discharge Instructions      Continue ibuprofen 800 mg 3 times a day with food Ice to knee Limit walking while knee is painful See orthopedic in follow up      ED Prescriptions   None    PDMP not reviewed this encounter.   Eustace Moore, MD 05/09/22 1248

## 2022-05-09 NOTE — Progress Notes (Signed)
Chief Complaint: Left knee pain     History of Present Illness:    Marissa Boone is a 47 y.o. female presenting for evaluation of pain in her left knee after an injury this morning.  She states that this morning while getting ready for work she tried to lift her left leg and subsequently felt a pop and sudden severe pain.  She does not recall any kind of twisting motion but feels like her left knee just "went out".  She describes the pain as a deep ache that is now 8/10 but has been 10/10 most of the morning.  She was seen in urgent care this morning who took x-rays and gave her a Toradol injection.  She has an unconfirmed diagnosis of EDS and says that she constantly feels like things are popping in and out.  She reports being unable to weight-bear or to bend her knee.  She has tried icing as well as ibuprofen.   Surgical History:   None  PMH/PSH/Family History/Social History/Meds/Allergies:    Past Medical History:  Diagnosis Date   EDS (Ehlers-Danlos syndrome)    Past Surgical History:  Procedure Laterality Date   APPENDECTOMY     CERVICAL SPINE SURGERY     DILATION AND CURETTAGE OF UTERUS     TONSILLECTOMY     Social History   Socioeconomic History   Marital status: Married    Spouse name: Not on file   Number of children: Not on file   Years of education: Not on file   Highest education level: Not on file  Occupational History   Not on file  Tobacco Use   Smoking status: Never   Smokeless tobacco: Never  Vaping Use   Vaping Use: Never used  Substance and Sexual Activity   Alcohol use: Yes    Comment: occasionally   Drug use: Not Currently   Sexual activity: Not on file  Other Topics Concern   Not on file  Social History Narrative   Not on file   Social Determinants of Health   Financial Resource Strain: Not on file  Food Insecurity: Not on file  Transportation Needs: Not on file  Physical Activity: Not on file   Stress: Not on file  Social Connections: Not on file   Family History  Problem Relation Age of Onset   Healthy Mother    Allergies  Allergen Reactions   Hydrocodone-Acetaminophen Other (See Comments)    vertigo   Current Outpatient Medications  Medication Sig Dispense Refill   ibuprofen (ADVIL) 800 MG tablet Take 1 tablet (800 mg total) by mouth every 8 (eight) hours as needed for up to 5 days. 15 tablet 0   promethazine (PHENERGAN) 25 MG tablet Take 1 tablet (25 mg total) by mouth every 6 (six) hours as needed for nausea or vomiting. (Patient not taking: Reported on 05/09/2022) 30 tablet 0   No current facility-administered medications for this visit.   DG Knee Complete 4 Views Left  Result Date: 05/09/2022 CLINICAL DATA:  Pain.  Sensation of malalignment. EXAM: LEFT KNEE - COMPLETE 4+ VIEW COMPARISON:  None Available. FINDINGS: Small amount of joint fluid. No fracture or dislocation. Normal weight-bearing compartment joint space height. 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 No other focal finding. IMPRESSION: Small amount of joint fluid. 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 Electronically Signed   By: Paulina Fusi M.D.   On: 05/09/2022 10:40    Review of Systems:   A ROS was performed including pertinent positives and negatives as documented in the HPI.  Physical Exam :   Constitutional: NAD and appears stated age Neurological: Alert and oriented Psych: Appropriate affect and cooperative There were no vitals taken for this visit.   Comprehensive Musculoskeletal Exam:    Patient is seated with left knee in extension and is significantly uncomfortable with any movement at the knee.  Unable to assess active or passive flexion of the knee.  Mild discomfort with palpation over medial and lateral joint lines as well as throughout posterior knee.  Patella is mobile and nontender.  No laxity with varus or valgus stress.  Negative Lachman's.  Mild  to moderate soft tissue edema. No palpable effusion.  Distal neurosensory exam intact.  Imaging:   Xray Review (Left knee 4 views): No fracture or dislocation with evidence of a mild effusion.   I personally reviewed and interpreted the radiographs.   Assessment:   47 y.o. female presenting with acute left knee pain after an injury this morning.  Due to her possible EDS as well as significant pain and swelling, I recommend obtaining an MRI to evaluate for soft tissue damage including meniscal tear or patellar instability.  I will also prescribe 5-day course of prescription strength ibuprofen for pain and inflammation control.  Will plan to see her back in 3-4 weeks for MRI follow-up.  I do believe she has some hip and quadricep weakness and would benefit from a strengthening program, however want to rule out acute injury with MRI.  Plan :    -Obtain MRI of left knee and return to clinic for MRI follow-up    I personally saw and evaluated the patient, and participated in the management and treatment plan.  Hazle Nordmann, PA-C Orthopedics  This document was dictated using Conservation officer, historic buildings. A reasonable attempt at proof reading has been made to minimize errors.

## 2022-05-10 ENCOUNTER — Telehealth (HOSPITAL_BASED_OUTPATIENT_CLINIC_OR_DEPARTMENT_OTHER): Payer: Self-pay | Admitting: Orthopaedic Surgery

## 2022-05-10 ENCOUNTER — Ambulatory Visit (INDEPENDENT_AMBULATORY_CARE_PROVIDER_SITE_OTHER): Payer: Commercial Managed Care - PPO

## 2022-05-10 DIAGNOSIS — M25562 Pain in left knee: Secondary | ICD-10-CM | POA: Diagnosis not present

## 2022-05-10 NOTE — Telephone Encounter (Signed)
Patient is getting her MRI tonight and wants to know when she can come for her results this week. She just wants to about work ect [phone].  This patient was a same day clinc

## 2022-05-12 ENCOUNTER — Other Ambulatory Visit (HOSPITAL_BASED_OUTPATIENT_CLINIC_OR_DEPARTMENT_OTHER): Payer: Self-pay | Admitting: Student

## 2022-05-12 ENCOUNTER — Telehealth: Payer: Self-pay | Admitting: Orthopaedic Surgery

## 2022-05-12 MED ORDER — MELOXICAM 15 MG PO TABS: 15.0000 mg | ORAL_TABLET | Freq: Every day | ORAL | 0 refills | Status: AC

## 2022-05-12 NOTE — Telephone Encounter (Signed)
Spoke with patient and discussed MRI results.  Recommended that we send her a referral for physical therapy and patient is agreeable.  Patient reports that she has gotten some inflammation improvement with ibuprofen however her pain levels are still high.  Discussed that I will send in a prescription of Mobic to take in place of the ibuprofen.

## 2022-05-12 NOTE — Telephone Encounter (Signed)
Hazle Nordmann, PA completed phone MRI results 4/11

## 2022-05-12 NOTE — Telephone Encounter (Signed)
Patient would like to go over MRI results with another provider if she cannot be worked in on [employer] schedule and was told she Jean Rosenthal cannot do her MRI review, she also has questions about the pain please advise, patient would like a call

## 2022-05-13 ENCOUNTER — Telehealth: Payer: Self-pay | Admitting: Student

## 2022-05-13 ENCOUNTER — Other Ambulatory Visit (HOSPITAL_BASED_OUTPATIENT_CLINIC_OR_DEPARTMENT_OTHER): Payer: Self-pay | Admitting: Student

## 2022-05-13 DIAGNOSIS — M25562 Pain in left knee: Secondary | ICD-10-CM

## 2022-05-13 NOTE — Telephone Encounter (Signed)
Patient called. She would like a PT referral faxed to [facility] [phone]

## 2022-05-16 ENCOUNTER — Telehealth: Payer: Self-pay | Admitting: Orthopaedic Surgery

## 2022-05-16 NOTE — Telephone Encounter (Signed)
Faxed order to the mentioned above.

## 2022-05-16 NOTE — Telephone Encounter (Signed)
Physical therapy said they did not receive the referral and they would like it sent to email  [email or web address] and fax number [phone] to ensure they get it

## 2022-10-20 ENCOUNTER — Emergency Department (HOSPITAL_BASED_OUTPATIENT_CLINIC_OR_DEPARTMENT_OTHER)
Admission: EM | Admit: 2022-10-20 | Discharge: 2022-10-20 | Disposition: A | Discharge status: Home / Self Care | Payer: Commercial Managed Care - PPO | Attending: Emergency Medicine | Admitting: Emergency Medicine

## 2022-10-20 ENCOUNTER — Other Ambulatory Visit: Payer: Self-pay

## 2022-10-20 ENCOUNTER — Encounter (HOSPITAL_BASED_OUTPATIENT_CLINIC_OR_DEPARTMENT_OTHER): Payer: Self-pay

## 2022-10-20 DIAGNOSIS — R5383 Other fatigue: Secondary | ICD-10-CM | POA: Diagnosis not present

## 2022-10-20 DIAGNOSIS — R232 Flushing: Secondary | ICD-10-CM | POA: Diagnosis not present

## 2022-10-20 DIAGNOSIS — R0789 Other chest pain: Secondary | ICD-10-CM | POA: Diagnosis not present

## 2022-10-20 DIAGNOSIS — R079 Chest pain, unspecified: Secondary | ICD-10-CM | POA: Diagnosis present

## 2022-10-20 DIAGNOSIS — R531 Weakness: Secondary | ICD-10-CM | POA: Diagnosis not present

## 2022-10-20 DIAGNOSIS — R519 Headache, unspecified: Secondary | ICD-10-CM | POA: Diagnosis not present

## 2022-10-20 DIAGNOSIS — R55 Syncope and collapse: Secondary | ICD-10-CM | POA: Insufficient documentation

## 2022-10-20 DIAGNOSIS — Z20822 Contact with and (suspected) exposure to covid-19: Secondary | ICD-10-CM | POA: Insufficient documentation

## 2022-10-20 LAB — CBC
HCT: 38.3 % (ref 36.0–46.0)
Hemoglobin: 12.6 g/dL (ref 12.0–15.0)
MCH: 28.8 pg (ref 26.0–34.0)
MCHC: 32.9 g/dL (ref 30.0–36.0)
MCV: 87.4 fL (ref 80.0–100.0)
Platelets: 271 10*3/uL (ref 150–400)
RBC: 4.38 MIL/uL (ref 3.87–5.11)
RDW: 13.2 % (ref 11.5–15.5)
WBC: 10.4 10*3/uL (ref 4.0–10.5)
nRBC: 0 % (ref 0.0–0.2)

## 2022-10-20 LAB — BASIC METABOLIC PANEL WITH GFR
Anion gap: 8 (ref 5–15)
BUN: 14 mg/dL (ref 6–20)
CO2: 24 mmol/L (ref 22–32)
Calcium: 9.1 mg/dL (ref 8.9–10.3)
Chloride: 107 mmol/L (ref 98–111)
Creatinine, Ser: 0.63 mg/dL (ref 0.44–1.00)
GFR, Estimated: >60 mL/min
Glucose, Bld: 108 mg/dL — ABNORMAL HIGH (ref 70–99)
Potassium: 3.6 mmol/L (ref 3.5–5.1)
Sodium: 139 mmol/L (ref 135–145)

## 2022-10-20 LAB — URINALYSIS, ROUTINE W REFLEX MICROSCOPIC
Bilirubin Urine: NEGATIVE
Glucose, UA: NEGATIVE mg/dL
Hgb urine dipstick: NEGATIVE
Ketones, ur: NEGATIVE mg/dL
Leukocytes,Ua: NEGATIVE
Nitrite: NEGATIVE
Protein, ur: NEGATIVE mg/dL
Specific Gravity, Urine: 1.023 (ref 1.005–1.030)
pH: 5.5 (ref 5.0–8.0)

## 2022-10-20 LAB — TSH: TSH: 4.553 u[IU]/mL — ABNORMAL HIGH (ref 0.350–4.500)

## 2022-10-20 LAB — SARS CORONAVIRUS 2 BY RT PCR: SARS Coronavirus 2 by RT PCR: NEGATIVE

## 2022-10-20 LAB — PREGNANCY, URINE: Preg Test, Ur: NEGATIVE

## 2022-10-20 LAB — TROPONIN I (HIGH SENSITIVITY): Troponin I (High Sensitivity): 5 ng/L (ref <18)

## 2022-10-20 LAB — CBG MONITORING, ED: Glucose-Capillary: 108 mg/dL — ABNORMAL HIGH (ref 70–99)

## 2022-10-20 MED ORDER — ALUM & MAG HYDROXIDE-SIMETH 200-200-20 MG/5ML PO SUSP
30.0000 mL | Freq: Once | ORAL | Status: AC
  Administered 2022-10-20: 30 mL via ORAL
  Filled 2022-10-20: qty 30

## 2022-10-20 MED ORDER — FAMOTIDINE 20 MG PO TABS
20.0000 mg | ORAL_TABLET | Freq: Once | ORAL | Status: AC
  Administered 2022-10-20: 20 mg via ORAL
  Filled 2022-10-20: qty 1

## 2022-10-20 MED ORDER — ACETAMINOPHEN 500 MG PO TABS
1000.0000 mg | ORAL_TABLET | Freq: Once | ORAL | Status: AC
  Administered 2022-10-20: 1000 mg via ORAL
  Filled 2022-10-20: qty 2

## 2022-10-20 NOTE — ED Triage Notes (Signed)
Pt c/o fatigue, "feeling sometimes like really hormonal, bad period- abd cramping." States she had a  syncopal episode "day before yesterday at [facility]," given fluids/ migraine cocktail w little relief. States she hasn't felt "much better since, but everything was normal at [facility]." Went back to [facility] today, advised to come to ED for further eval. No covid test at [facility]

## 2022-10-20 NOTE — Discharge Instructions (Signed)
It was our pleasure to provide your ER care today - we hope that you feel better.  Overall, your lab tests look good/normal. Your blood count, chemistries, heart tests, and urine tests look good.   Drink plenty of fluids/stay well hydrated. Take acetaminophen or ibuprofen as need.   If GI/reflux symptoms, try taking pepcid and/or maalox as need for symptom relief.   For recent symptoms, follow up closely with your primary care doctor, and your ob/gyn doctor in the next 1-2 weeks - call office to arrange appointment.   Return to ER if worse, new symptoms, fevers, recurrent/persistent chest pain, increased trouble breathing, fainting, or other concern.

## 2022-10-20 NOTE — ED Provider Notes (Signed)
[place] EMERGENCY DEPARTMENT AT [facility] Provider Note   CSN: [id number] Arrival date & time: 10/20/22  1124     History  Chief Complaint  Patient presents with   Loss of Consciousness    2 days ago    Marissa Boone is a 47 y.o. female.  Pt indicates in past couple weeks has felt generally weak, fatigued, flush in face, discomfort in mid chest, burping/gas. Symptoms at rest. No relation to activity or exertion. No palpitations. No sob. No cough or sore throat. No specific known ill contacts. Has also noted some vaginal spotting which is not necessarily new, indicates had mirena iud placed ~ 4 years ago. No heavy bleeding or unusual discharge. No pelvic pain. No other abnormal bruising or bleeding. No rectal bleeding or melena. No dysuria or hematuria.  No recent change in meds or new meds. No change in home or personal products. Was on GLP medicine but stopped a month ago. States was at urgent care for same a couple days ago - indicates at counter had syncopal event (chart reviewed - pt not noted w loc or sz then).  Pt pt notes intermittent headache, mild-mod. No acute, abrupt or severe head pain. No change in speech or vision. No numbness/weakness or loss of normal functional ability. No sinus pain or drainage. No eye pain or change in vision. No neck stiffness/rigidity.   The history is provided by the patient, medical records and the spouse.  Loss of Consciousness Associated symptoms: no chest pain, no confusion, no fever, no shortness of breath and no vomiting        Home Medications Prior to Admission medications   Medication Sig Start Date End Date Taking? Authorizing Provider  promethazine (PHENERGAN) 25 MG tablet Take 1 tablet (25 mg total) by mouth every 6 (six) hours as needed for nausea or vomiting. Patient not taking: Reported on 05/09/2022 12/16/21   Eustace Moore, MD      Allergies    Hydrocodone-acetaminophen    Review of Systems   Review of  Systems  Constitutional:  Negative for chills and fever.  HENT:  Negative for sore throat.   Eyes:  Negative for visual disturbance.  Respiratory:  Negative for cough and shortness of breath.   Cardiovascular:  Positive for syncope. Negative for chest pain.  Gastrointestinal:  Negative for abdominal pain, diarrhea and vomiting.  Genitourinary:  Negative for dysuria and flank pain.  Musculoskeletal:  Negative for back pain and neck pain.  Skin:  Negative for rash.  Neurological:  Positive for light-headedness.  Hematological:  Does not bruise/bleed easily.  Psychiatric/Behavioral:  Negative for confusion.     Physical Exam Updated Vital Signs BP (!) 105/58   Pulse (!) 58   Temp 98.5 F (36.9 C)   Resp 15   SpO2 98%  Physical Exam Vitals and nursing note reviewed.  Constitutional:      Appearance: Normal appearance. She is well-developed.  HENT:     Head: Atraumatic.     Comments: No sinus or temporal tenderness.     Nose: Nose normal.     Mouth/Throat:     Mouth: Mucous membranes are moist.  Eyes:     General: No scleral icterus.    Conjunctiva/sclera: Conjunctivae normal.     Pupils: Pupils are equal, round, and reactive to light.  Neck:     Vascular: No carotid bruit.     Trachea: No tracheal deviation.  Cardiovascular:     Rate and Rhythm:  Normal rate and regular rhythm.     Pulses: Normal pulses.     Heart sounds: Normal heart sounds. No murmur heard.    No friction rub. No gallop.  Pulmonary:     Effort: Pulmonary effort is normal. No respiratory distress.     Breath sounds: Normal breath sounds.  Abdominal:     General: Bowel sounds are normal. There is no distension.     Palpations: Abdomen is soft. There is no mass.     Tenderness: There is no abdominal tenderness. There is no guarding.  Genitourinary:    Comments: No cva tenderness.  Musculoskeletal:        General: No swelling.     Cervical back: Normal range of motion and neck supple. No rigidity or  tenderness. No muscular tenderness.     Right lower leg: No edema.     Left lower leg: No edema.     Comments: C spine non tender. No focal extremity pain or swelling noted.   Skin:    General: Skin is warm and dry.     Findings: No rash.  Neurological:     Mental Status: She is alert.     Comments: Alert, speech normal. Motor/sens grossly intact bil. Steady gait.   Psychiatric:        Mood and Affect: Mood normal.     ED Results / Procedures / Treatments   Labs (all labs ordered are listed, but only abnormal results are displayed) Results for orders placed or performed during the hospital encounter of 10/20/22  SARS Coronavirus 2 by RT PCR (hospital order, performed in [facility] hospital lab) *cepheid single result test* Anterior Nasal Swab   Specimen: Anterior Nasal Swab  Result Value Ref Range   SARS Coronavirus 2 by RT PCR NEGATIVE NEGATIVE  Basic metabolic panel  Result Value Ref Range   Sodium 139 135 - 145 mmol/L   Potassium 3.6 3.5 - 5.1 mmol/L   Chloride 107 98 - 111 mmol/L   CO2 24 22 - 32 mmol/L   Glucose, Bld 108 (H) 70 - 99 mg/dL   BUN 14 6 - 20 mg/dL   Creatinine, Ser 1.61 0.44 - 1.00 mg/dL   Calcium 9.1 8.9 - 09.6 mg/dL   GFR, Estimated >04 >54 mL/min   Anion gap 8 5 - 15  CBC  Result Value Ref Range   WBC 10.4 4.0 - 10.5 K/uL   RBC 4.38 3.87 - 5.11 MIL/uL   Hemoglobin 12.6 12.0 - 15.0 g/dL   HCT 09.8 11.9 - 14.7 %   MCV 87.4 80.0 - 100.0 fL   MCH 28.8 26.0 - 34.0 pg   MCHC 32.9 30.0 - 36.0 g/dL   RDW 82.9 56.2 - 13.0 %   Platelets 271 150 - 400 K/uL   nRBC 0.0 0.0 - 0.2 %  Urinalysis, Routine w reflex microscopic -Urine, Clean Catch  Result Value Ref Range   Color, Urine YELLOW YELLOW   APPearance CLEAR CLEAR   Specific Gravity, Urine 1.023 1.005 - 1.030   pH 5.5 5.0 - 8.0   Glucose, UA NEGATIVE NEGATIVE mg/dL   Hgb urine dipstick NEGATIVE NEGATIVE   Bilirubin Urine NEGATIVE NEGATIVE   Ketones, ur NEGATIVE NEGATIVE mg/dL   Protein, ur  NEGATIVE NEGATIVE mg/dL   Nitrite NEGATIVE NEGATIVE   Leukocytes,Ua NEGATIVE NEGATIVE  Pregnancy, urine  Result Value Ref Range   Preg Test, Ur NEGATIVE NEGATIVE  CBG monitoring, ED  Result Value Ref Range  Glucose-Capillary 108 (H) 70 - 99 mg/dL  Troponin I (High Sensitivity)  Result Value Ref Range   Troponin I (High Sensitivity) 5 <18 ng/L      EKG EKG Interpretation Date/Time:  Thursday October 20 2022 11:43:26 EDT Ventricular Rate:  69 PR Interval:  140 QRS Duration:  76 QT Interval:  386 QTC Calculation: 413 R Axis:   43  Text Interpretation: Normal sinus rhythm Confirmed by Cathren Laine ([id number]) on 10/20/2022 11:45:24 AM  Radiology No results found.  Procedures Procedures    Medications Ordered in ED Medications  alum & mag hydroxide-simeth (MAALOX/MYLANTA) 200-200-20 MG/5ML suspension 30 mL (30 mLs Oral Given 10/20/22 1403)  famotidine (PEPCID) tablet 20 mg (20 mg Oral Given 10/20/22 1402)  acetaminophen (TYLENOL) tablet 1,000 mg (1,000 mg Oral Given 10/20/22 1403)    ED Course/ Medical Decision Making/ A&P                                 Medical Decision Making Problems Addressed: Atypical chest pain: acute illness or injury with systemic symptoms that poses a threat to life or bodily functions Flushing: acute illness or injury Generalized weakness: acute illness or injury with systemic symptoms Intermittent headache: acute illness or injury Other fatigue: acute illness or injury  Amount and/or Complexity of Data Reviewed Independent Historian: spouse    Details: hx External Data Reviewed: notes. Labs: ordered. Decision-making details documented in ED Course. ECG/medicine tests: ordered and independent interpretation performed. Decision-making details documented in ED Course.  Risk OTC drugs. Decision regarding hospitalization.   Iv ns. Continuous pulse ox and cardiac monitoring. Labs ordered/sent.   Differential diagnosis includes  dehydration, anemia, etc. Dispo decision including potential need for admission considered - will get labs and reassess.   Reviewed nursing notes and prior charts for additional history. External reports reviewed. Additional history from: spouse.   Cardiac monitor: sinus rhythm, rate 68.  Labs reviewed/interpreted by me - wbc and hgb normal. Chem normal. Preg neg. Ua neg for uti. Trop normal - after symptoms present for days, felt not c/w acs.  Pt with episode burping/indigestion. Pepcid and maalox for symptom relief.   Vitals normal. No distress. Pt currently appears stable for d/c.   Rec close pcp f/u.  Return precautions provided.          Final Clinical Impression(s) / ED Diagnoses Final diagnoses:  None    Rx / DC Orders ED Discharge Orders     None         Cathren Laine, MD 10/20/22 1503
